# Patient Record
Sex: Male | Born: 2000 | Race: Asian | Hispanic: No | Marital: Single
Health system: Southern US, Community
[De-identification: ages and names within clinical notes are randomized; demographics above are authoritative.]

---
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
## 2022-10-16 ENCOUNTER — Other Ambulatory Visit: Payer: Self-pay

## 2022-10-16 ENCOUNTER — Emergency Department (HOSPITAL_COMMUNITY)
Admission: EM | Admit: 2022-10-16 | Discharge: 2022-10-16 | Disposition: A | Discharge status: Home / Self Care | Payer: Medicaid Other | Attending: Emergency Medicine | Admitting: Emergency Medicine

## 2022-10-16 DIAGNOSIS — K611 Rectal abscess: Secondary | ICD-10-CM | POA: Insufficient documentation

## 2022-10-16 DIAGNOSIS — L0291 Cutaneous abscess, unspecified: Secondary | ICD-10-CM

## 2022-10-16 DIAGNOSIS — K6289 Other specified diseases of anus and rectum: Secondary | ICD-10-CM | POA: Diagnosis present

## 2022-10-16 MED ORDER — DOXYCYCLINE HYCLATE 100 MG PO CAPS: 100.0000 mg | ORAL_CAPSULE | Freq: Two times a day (BID) | ORAL | 0 refills | Status: AC

## 2022-10-16 MED ORDER — DOXYCYCLINE HYCLATE 100 MG PO TABS
100.0000 mg | ORAL_TABLET | Freq: Once | ORAL | Status: AC
  Administered 2022-10-16: 100 mg via ORAL
  Filled 2022-10-16: qty 1

## 2022-10-16 MED ORDER — LIDOCAINE HCL URETHRAL/MUCOSAL 2 % EX GEL
1.0000 | Freq: Once | CUTANEOUS | Status: AC
  Administered 2022-10-16: 1 via TOPICAL
  Filled 2022-10-16: qty 11

## 2022-10-16 NOTE — Discharge Instructions (Signed)
Warm compresses at least 4 times a day for 10 to 15 minutes at a time.  Take the antibiotics as prescribed.  I have given you follow-up with the general surgeons.  They need to see you and decide if this is something that might need a procedure.  Hopefully this just gets better on its own.  Please return to the emergency department for rapid worsening or if you develop a fever.

## 2022-10-16 NOTE — ED Provider Triage Note (Signed)
Emergency Medicine Provider Triage Evaluation Note  Dylan Krause , a 22 y.o. male  was evaluated in triage.  Pt complains of perianal pain and BRBPR. Seen 2 days ago at [facility] ED for hemorrhoids. Has been taking Tylenol with persistent pain. States bleeding has increased. Denies fevers and purulent drainage.  Review of Systems  Positive: As above Negative: As above  Physical Exam  BP 124/83 (BP Location: Right Arm)   Pulse 81   Temp 98.2 F (36.8 C) (Oral)   Resp 14   SpO2 100%  Gen:   Awake, no distress   Resp:  Normal effort  MSK:   Moves extremities without difficulty  Other:  GU exam deferred until roomed in ED  Medical Decision Making  Medically screening exam initiated at 6:15 AM.  Appropriate orders placed.  Dylan Krause was informed that the remainder of the evaluation will be completed by another provider, this initial triage assessment does not replace that evaluation, and the importance of remaining in the ED until their evaluation is complete.  Perianal pain and BRBPR   Antonietta Breach, PA-C 10/16/22 [id number]

## 2022-10-16 NOTE — ED Triage Notes (Signed)
Patient reports pain with swelling and bleeding hemorrhoids for several days .

## 2022-10-16 NOTE — ED Provider Notes (Signed)
[facility] Provider Note   CSN: [id number] Arrival date & time: 10/16/22  0548     History  Chief Complaint  Patient presents with   Hemorrhoids    Dylan Krause is a 22 y.o. male.  22 yo M with a cc of rectal pain.  Going on for a few days now.  Felt like there was a bubble there and then it popped recently.  Having some bloody like stuff come out of it.  No fevers or chills.  No trauma.        Home Medications Prior to Admission medications   Medication Sig Start Date End Date Taking? Authorizing Provider  doxycycline (VIBRAMYCIN) 100 MG capsule Take 1 capsule (100 mg total) by mouth 2 (two) times daily. One po bid x 7 days 10/16/22  Yes Deno Etienne, DO      Allergies    Patient has no known allergies.    Review of Systems   Review of Systems  Physical Exam Updated Vital Signs BP 124/83 (BP Location: Right Arm)   Pulse 81   Temp 98.2 F (36.8 C) (Oral)   Resp 14   SpO2 100%  Physical Exam Vitals and nursing note reviewed.  Constitutional:      Appearance: He is well-developed.  HENT:     Head: Normocephalic and atraumatic.  Eyes:     Pupils: Pupils are equal, round, and reactive to light.  Neck:     Vascular: No JVD.  Cardiovascular:     Rate and Rhythm: Normal rate and regular rhythm.     Heart sounds: No murmur heard.    No friction rub. No gallop.  Pulmonary:     Effort: No respiratory distress.     Breath sounds: No wheezing.  Abdominal:     General: There is no distension.     Tenderness: There is no abdominal tenderness. There is no guarding or rebound.  Genitourinary:      Comments: Area of erythema fluctuance and active drainage.  Seems to spare the rectum. Musculoskeletal:        General: Normal range of motion.     Cervical back: Normal range of motion and neck supple.  Skin:    Coloration: Skin is not pale.     Findings: No rash.  Neurological:     Mental Status: He is alert and  oriented to person, place, and time.  Psychiatric:        Behavior: Behavior normal.     ED Results / Procedures / Treatments   Labs (all labs ordered are listed, but only abnormal results are displayed) Labs Reviewed - No data to display  EKG None  Radiology No results found.  Procedures Procedures    Medications Ordered in ED Medications  lidocaine (XYLOCAINE) 2 % jelly 1 Application (has no administration in time range)  doxycycline (VIBRA-TABS) tablet 100 mg (has no administration in time range)    ED Course/ Medical Decision Making/ A&P                             Medical Decision Making Risk Prescription drug management.   22 yo M with a chief complaints of rectal pain.  Clinically the patient has an abscess, this is concerning for the location that it is in but it is actively draining it is not appear to actively extend into the rectum.  Warm compresses 4 times a  day.  Antibiotics.  PCP and general surgery follow-up.  7:40 AM:  I have discussed the diagnosis/risks/treatment options with the patient.  Evaluation and diagnostic testing in the emergency department does not suggest an emergent condition requiring admission or immediate intervention beyond what has been performed at this time.  They will follow up with GI, PCP. We also discussed returning to the ED immediately if new or worsening sx occur. We discussed the sx which are most concerning (e.g., sudden worsening pain, fever, inability to tolerate by mouth) that necessitate immediate return. Medications administered to the patient during their visit and any new prescriptions provided to the patient are listed below.  Medications given during this visit Medications  lidocaine (XYLOCAINE) 2 % jelly 1 Application (has no administration in time range)  doxycycline (VIBRA-TABS) tablet 100 mg (has no administration in time range)     The patient appears reasonably screen and/or stabilized for discharge and I  doubt any other medical condition or other [facility] requiring further screening, evaluation, or treatment in the ED at this time prior to discharge.          Final Clinical Impression(s) / ED Diagnoses Final diagnoses:  Abscess    Rx / DC Orders ED Discharge Orders          Ordered    doxycycline (VIBRAMYCIN) 100 MG capsule  2 times daily        10/16/22 [place], Zachariah Pavek, DO 10/16/22 0740
